# Patient Record
Sex: Female | Born: 1955 | Race: Black or African American | Hispanic: No | Smoking: Never smoker
Health system: Southern US, Community
[De-identification: ages and names within clinical notes are randomized; demographics above are authoritative.]

## PROBLEM LIST (undated history)

## (undated) DIAGNOSIS — D869 Sarcoidosis, unspecified: Secondary | ICD-10-CM

---

## 2018-08-09 ENCOUNTER — Other Ambulatory Visit: Payer: Self-pay

## 2018-08-09 ENCOUNTER — Emergency Department (HOSPITAL_COMMUNITY): Payer: Managed Care, Other (non HMO)

## 2018-08-09 ENCOUNTER — Inpatient Hospital Stay (HOSPITAL_COMMUNITY)
Admission: EM | Admit: 2018-08-09 | Discharge: 2018-08-12 | DRG: 640 | Disposition: A | Discharge status: Home Health | Payer: Managed Care, Other (non HMO) | Attending: Internal Medicine | Admitting: Internal Medicine

## 2018-08-09 ENCOUNTER — Encounter (HOSPITAL_COMMUNITY): Payer: Self-pay | Admitting: Emergency Medicine

## 2018-08-09 DIAGNOSIS — R410 Disorientation, unspecified: Secondary | ICD-10-CM

## 2018-08-09 DIAGNOSIS — Z79899 Other long term (current) drug therapy: Secondary | ICD-10-CM

## 2018-08-09 DIAGNOSIS — D869 Sarcoidosis, unspecified: Secondary | ICD-10-CM | POA: Diagnosis present

## 2018-08-09 DIAGNOSIS — Z681 Body mass index (BMI) 19 or less, adult: Secondary | ICD-10-CM

## 2018-08-09 DIAGNOSIS — G9341 Metabolic encephalopathy: Secondary | ICD-10-CM | POA: Diagnosis present

## 2018-08-09 DIAGNOSIS — E876 Hypokalemia: Secondary | ICD-10-CM | POA: Diagnosis present

## 2018-08-09 DIAGNOSIS — E43 Unspecified severe protein-calorie malnutrition: Secondary | ICD-10-CM

## 2018-08-09 HISTORY — DX: Sarcoidosis, unspecified: D86.9

## 2018-08-09 LAB — CBC
HCT: 35.2 % — ABNORMAL LOW (ref 36.0–46.0)
Hemoglobin: 11.1 g/dL — ABNORMAL LOW (ref 12.0–15.0)
MCH: 25.6 pg — ABNORMAL LOW (ref 26.0–34.0)
MCHC: 31.5 g/dL (ref 30.0–36.0)
MCV: 81.1 fL (ref 80.0–100.0)
Platelets: 175 10*3/uL (ref 150–400)
RBC: 4.34 MIL/uL (ref 3.87–5.11)
RDW: 15.9 % — ABNORMAL HIGH (ref 11.5–15.5)
WBC: 3.1 10*3/uL — ABNORMAL LOW (ref 4.0–10.5)
nRBC: 0 % (ref 0.0–0.2)

## 2018-08-09 LAB — URINALYSIS, ROUTINE W REFLEX MICROSCOPIC
Bilirubin Urine: NEGATIVE
Glucose, UA: NEGATIVE mg/dL
Hgb urine dipstick: NEGATIVE
Ketones, ur: NEGATIVE mg/dL
Nitrite: NEGATIVE
Protein, ur: NEGATIVE mg/dL
Specific Gravity, Urine: 1.008 (ref 1.005–1.030)
pH: 7 (ref 5.0–8.0)

## 2018-08-09 LAB — CBG MONITORING, ED: Glucose-Capillary: 87 mg/dL (ref 70–99)

## 2018-08-09 LAB — COMPREHENSIVE METABOLIC PANEL
ALT: 15 U/L (ref 0–44)
AST: 22 U/L (ref 15–41)
Albumin: 4 g/dL (ref 3.5–5.0)
Alkaline Phosphatase: 37 U/L — ABNORMAL LOW (ref 38–126)
Anion gap: 9 (ref 5–15)
BUN: 16 mg/dL (ref 8–23)
CO2: 28 mmol/L (ref 22–32)
Calcium: 14.4 mg/dL (ref 8.9–10.3)
Chloride: 98 mmol/L (ref 98–111)
Creatinine, Ser: 1.1 mg/dL — ABNORMAL HIGH (ref 0.44–1.00)
GFR calc Af Amer: >60 mL/min (ref >60)
GFR calc non Af Amer: 54 mL/min — ABNORMAL LOW (ref >60)
Glucose, Bld: 96 mg/dL (ref 70–99)
Potassium: 2.8 mmol/L — ABNORMAL LOW (ref 3.5–5.1)
Sodium: 135 mmol/L (ref 135–145)
Total Bilirubin: 0.7 mg/dL (ref 0.3–1.2)
Total Protein: 9.3 g/dL — ABNORMAL HIGH (ref 6.5–8.1)

## 2018-08-09 LAB — I-STAT TROPONIN, ED: TROPONIN I, POC: 0.04 ng/mL (ref 0.00–0.08)

## 2018-08-09 LAB — MAGNESIUM: Magnesium: 2.4 mg/dL (ref 1.7–2.4)

## 2018-08-09 LAB — TSH: TSH: 0.843 u[IU]/mL (ref 0.350–4.500)

## 2018-08-09 MED ORDER — LACTATED RINGERS IV SOLN
INTRAVENOUS | Status: DC
  Administered 2018-08-09: via INTRAVENOUS

## 2018-08-09 MED ORDER — SODIUM CHLORIDE 0.9 % IV BOLUS
500.0000 mL | Freq: Once | INTRAVENOUS | Status: AC
  Administered 2018-08-09: 500 mL via INTRAVENOUS

## 2018-08-09 MED ORDER — POTASSIUM CHLORIDE 10 MEQ/100ML IV SOLN
10.0000 meq | INTRAVENOUS | Status: AC
  Administered 2018-08-09 – 2018-08-10 (×4): 10 meq via INTRAVENOUS
  Filled 2018-08-09 (×5): qty 100

## 2018-08-09 NOTE — ED Provider Notes (Signed)
Emergency Department Provider Note   I have reviewed the triage vital signs and the nursing notes.   HISTORY  Chief Complaint Shortness of Breath   HPI Ebony Bryant is a 63 y.o. female with past medical history of sarcoidosis presents to the emergency department with continued altered mental status and fatigue.  Family state that she has had symptoms worsening over the past week.  She went to an outside emergency department with full work-up at that time which showed elevated calcium and low potassium but family state that the patient refused admission at that time.  They followed up with the PCP several days later and report to me that labs had normalized at that time.  Patient underwent CT imaging of the head chest, pelvis with no report of acute findings.  These lab results and imaging results are not available to me at this time.  They state that the patient has continued to decline with sleepiness and muscle twitching.  No fevers.  Patient denies any headaches, chest pain, shortness of breath but history is limited by her somnolence and acute delirium symptoms.  Level 5 caveat applies.  Family state that they are also concerned about possible shortness of breath because times the patient will make a high-pitched noise when she breathes for a single breath and then return to normal breathing.  They note that she typically does this when she is agitated or not wanting to respond to questions.   Past Medical History:  Diagnosis Date  . Sarcoidosis     There are no active problems to display for this patient.   Allergies Patient has no known allergies.  No family history on file.  Social History Social History   Tobacco Use  . Smoking status: Not on file  Substance Use Topics  . Alcohol use: Not on file  . Drug use: Not on file    Review of Systems  Constitutional: No fever/chills Eyes: No visual changes. ENT: No sore throat. Cardiovascular: Denies chest  pain. Respiratory: Denies shortness of breath. Gastrointestinal: No abdominal pain.  No nausea, no vomiting.  No diarrhea.  No constipation. Genitourinary: Negative for dysuria. Musculoskeletal: Negative for back pain. Skin: Negative for rash. Neurological: Negative for headaches, focal weakness or numbness.  10-point ROS otherwise negative.  ____________________________________________   PHYSICAL EXAM:  VITAL SIGNS: ED Triage Vitals  Enc Vitals Group     BP 08/09/18 1922 (!) 173/105     Pulse Rate 08/09/18 1922 93     Resp 08/09/18 1922 20     Temp 08/09/18 1922 (!) 97.5 F (36.4 C)     Temp Source 08/09/18 1922 Oral     SpO2 08/09/18 1922 96 %     Weight 08/09/18 1923 98 lb (44.5 kg)     Height 08/09/18 1923 5\' 6"  (1.676 m)   Constitutional: Drowsy and not very cooperative. Well appearing and in no acute distress. Eyes: Conjunctivae are normal.  Head: Atraumatic. Nose: No congestion/rhinnorhea. Mouth/Throat: Mucous membranes are dry.  Neck: No stridor.   Cardiovascular: Normal rate, regular rhythm. Good peripheral circulation. Grossly normal heart sounds.   Respiratory: Normal respiratory effort.  No retractions. Lungs CTAB. Gastrointestinal: Soft and nontender. No distention.  Musculoskeletal: No lower extremity tenderness nor edema. No gross deformities of extremities. Normal ROM of upper and lower extremities.  Neurologic:  Normal speech and language. No gross focal neurologic deficits are appreciated. Normal CN exam 2-12.  Skin:  Skin is warm, dry and intact. No rash  noted.   ____________________________________________   LABS (all labs ordered are listed, but only abnormal results are displayed)  Labs Reviewed  COMPREHENSIVE METABOLIC PANEL - Abnormal; Notable for the following components:      Result Value   Potassium 2.8 (*)    Creatinine, Ser 1.10 (*)    Calcium 14.4 (*)    Total Protein 9.3 (*)    Alkaline Phosphatase 37 (*)    GFR calc non Af Amer 54  (*)    All other components within normal limits  CBC - Abnormal; Notable for the following components:   WBC 3.1 (*)    Hemoglobin 11.1 (*)    HCT 35.2 (*)    MCH 25.6 (*)    RDW 15.9 (*)    All other components within normal limits  URINALYSIS, ROUTINE W REFLEX MICROSCOPIC - Abnormal; Notable for the following components:   APPearance HAZY (*)    Leukocytes, UA SMALL (*)    Bacteria, UA FEW (*)    All other components within normal limits  URINE CULTURE  TSH  MAGNESIUM  TROPONIN I  CBG MONITORING, ED  I-STAT TROPONIN, ED   ____________________________________________  EKG   EKG Interpretation  Date/Time:  Wednesday August 09 2018 20:36:51 EST Ventricular Rate:  91 PR Interval:  156 QRS Duration: 91 QT Interval:  355 QTC Calculation: 437 R Axis:   70 Text Interpretation:  Sinus rhythm Ventricular premature complex Biatrial enlargement Probable LVH with secondary repol abnrm No STEMI.  Confirmed by Alona Bene [phone]) on 08/09/2018 11:47:14 PM       ____________________________________________  RADIOLOGY  Dg Chest 2 View  Result Date: 08/09/2018 CLINICAL DATA:  Shortness of breath, clinical history of sarcoid. EXAM: CHEST - 2 VIEW COMPARISON:  None. FINDINGS: Hilar retraction bilaterally. Diffuse interstitial reticular opacity suggesting fibrosis. More confluent left greater than right upper lobe opacity. No pleural effusion. Borderline cardiomegaly. No pneumothorax. IMPRESSION: 1. Diffuse reticulonodular pattern with hilar retraction and probable fibrosis in the mid to upper lung zones, likely related to given history of sarcoidosis. 2. There is ground-glass opacity in the left greater than right upper lobes, superimposed infection or acute inflammatory process is difficult to exclude. Electronically Signed   By: Jasmine Pang M.D.   On: 08/09/2018 21:40    ____________________________________________   PROCEDURES  Procedure(s) performed:    Procedures  CRITICAL CARE Performed by: Maia Plan Total critical care time: 35 minutes Critical care time was exclusive of separately billable procedures and treating other patients. Critical care was necessary to treat or prevent imminent or life-threatening deterioration. Critical care was time spent personally by me on the following activities: development of treatment plan with patient and/or surrogate as well as nursing, discussions with consultants, evaluation of patient's response to treatment, examination of patient, obtaining history from patient or surrogate, ordering and performing treatments and interventions, ordering and review of laboratory studies, ordering and review of radiographic studies, pulse oximetry and re-evaluation of patient's condition.  Alona Bene, MD Emergency Medicine  ____________________________________________   INITIAL IMPRESSION / ASSESSMENT AND PLAN / ED COURSE  Pertinent labs & imaging results that were available during my care of the patient were reviewed by me and considered in my medical decision making (see chart for details).  Patient presents to the emergency department with altered mental status.  No focal deficits but patient is more delirious.  She is unable to carry on a full conversation and is somewhat agitated at times.  She does make eye  contact and follow commands.  She is protecting her airway.  I do not appreciate any focal or unilateral neurological deficits to require head imaging.  Family tells me that patient had full set of imaging done as an outpatient several days ago.  The patient's lab work here does show significant hypercalcemia and hypokalemia which are likely contributing to her symptoms.  I have started IV fluids and will supplement her potassium.  UA is pending.  Patient is afebrile.  Doubt sepsis or ACS.  Plan for admission.   Discussed patient's case with Hospitalist, Dr. Sharl MaLama to request admission. Patient and  family (if present) updated with plan. Care transferred to Hospitalist service.  I reviewed all nursing notes, vitals, pertinent old records, EKGs, labs, imaging (as available).  ____________________________________________  FINAL CLINICAL IMPRESSION(S) / ED DIAGNOSES  Final diagnoses:  Hypercalcemia  Hypokalemia  Delirium     MEDICATIONS GIVEN DURING THIS VISIT:  Medications  lactated ringers infusion (has no administration in time range)  potassium chloride 10 mEq in 100 mL IVPB (has no administration in time range)  sodium chloride 0.9 % bolus 500 mL (0 mLs Intravenous Stopped 08/09/18 2317)    Note:  This document was prepared using Dragon voice recognition software and may include unintentional dictation errors.  Alona BeneJoshua Long, MD Emergency Medicine    Long, Arlyss RepressJoshua G, MD 08/09/18 [phone]

## 2018-08-09 NOTE — ED Triage Notes (Signed)
Per family pt has been having SOB, weakness and confusion since December 25. Pt was seen at ER 12/30 and was sent home with no diagnosis. Pt was seen by pcp on 1/2 and had blood work and CT scans done. Pt family states pt has progressively gotten worse since dr appt.

## 2018-08-09 NOTE — ED Notes (Signed)
Date and time results received: 08/09/18 2135 (use smartphrase ".now" to insert current time)  Test: ca  Critical Value: 14.4  Name of Provider Notified: long  Orders Received? Or Actions Taken?: Orders Received - See Orders for details

## 2018-08-10 ENCOUNTER — Other Ambulatory Visit: Payer: Self-pay

## 2018-08-10 ENCOUNTER — Encounter (HOSPITAL_COMMUNITY): Payer: Self-pay | Admitting: *Deleted

## 2018-08-10 DIAGNOSIS — E43 Unspecified severe protein-calorie malnutrition: Secondary | ICD-10-CM | POA: Diagnosis present

## 2018-08-10 DIAGNOSIS — Z681 Body mass index (BMI) 19 or less, adult: Secondary | ICD-10-CM | POA: Diagnosis not present

## 2018-08-10 DIAGNOSIS — Z79899 Other long term (current) drug therapy: Secondary | ICD-10-CM | POA: Diagnosis not present

## 2018-08-10 DIAGNOSIS — G9341 Metabolic encephalopathy: Secondary | ICD-10-CM | POA: Diagnosis present

## 2018-08-10 DIAGNOSIS — R410 Disorientation, unspecified: Secondary | ICD-10-CM

## 2018-08-10 DIAGNOSIS — D869 Sarcoidosis, unspecified: Secondary | ICD-10-CM | POA: Diagnosis present

## 2018-08-10 DIAGNOSIS — E876 Hypokalemia: Secondary | ICD-10-CM | POA: Diagnosis present

## 2018-08-10 DIAGNOSIS — R531 Weakness: Secondary | ICD-10-CM | POA: Diagnosis not present

## 2018-08-10 LAB — CBC WITH DIFFERENTIAL/PLATELET
Abs Immature Granulocytes: 0.01 10*3/uL (ref 0.00–0.07)
BASOS ABS: 0 10*3/uL (ref 0.0–0.1)
Basophils Relative: 1 %
Eosinophils Absolute: 0.1 10*3/uL (ref 0.0–0.5)
Eosinophils Relative: 4 %
HCT: 30.5 % — ABNORMAL LOW (ref 36.0–46.0)
Hemoglobin: 9.6 g/dL — ABNORMAL LOW (ref 12.0–15.0)
Immature Granulocytes: 0 %
Lymphocytes Relative: 21 %
Lymphs Abs: 0.5 10*3/uL — ABNORMAL LOW (ref 0.7–4.0)
MCH: 26.4 pg (ref 26.0–34.0)
MCHC: 31.5 g/dL (ref 30.0–36.0)
MCV: 83.8 fL (ref 80.0–100.0)
Monocytes Absolute: 0.5 10*3/uL (ref 0.1–1.0)
Monocytes Relative: 19 %
NRBC: 0 % (ref 0.0–0.2)
Neutro Abs: 1.4 10*3/uL — ABNORMAL LOW (ref 1.7–7.7)
Neutrophils Relative %: 55 %
Platelets: 139 10*3/uL — ABNORMAL LOW (ref 150–400)
RBC: 3.64 MIL/uL — ABNORMAL LOW (ref 3.87–5.11)
RDW: 15.9 % — ABNORMAL HIGH (ref 11.5–15.5)
WBC: 2.6 10*3/uL — AB (ref 4.0–10.5)

## 2018-08-10 LAB — CBC
HCT: 33.3 % — ABNORMAL LOW (ref 36.0–46.0)
Hemoglobin: 10.6 g/dL — ABNORMAL LOW (ref 12.0–15.0)
MCH: 26.1 pg (ref 26.0–34.0)
MCHC: 31.8 g/dL (ref 30.0–36.0)
MCV: 82 fL (ref 80.0–100.0)
Platelets: 130 10*3/uL — ABNORMAL LOW (ref 150–400)
RBC: 4.06 MIL/uL (ref 3.87–5.11)
RDW: 15.9 % — ABNORMAL HIGH (ref 11.5–15.5)
WBC: 2.1 10*3/uL — ABNORMAL LOW (ref 4.0–10.5)
nRBC: 0 % (ref 0.0–0.2)

## 2018-08-10 LAB — COMPREHENSIVE METABOLIC PANEL
ALT: 13 U/L (ref 0–44)
AST: 19 U/L (ref 15–41)
Albumin: 3.3 g/dL — ABNORMAL LOW (ref 3.5–5.0)
Alkaline Phosphatase: 34 U/L — ABNORMAL LOW (ref 38–126)
Anion gap: 6 (ref 5–15)
BUN: 15 mg/dL (ref 8–23)
CO2: 29 mmol/L (ref 22–32)
Calcium: 13.4 mg/dL (ref 8.9–10.3)
Chloride: 104 mmol/L (ref 98–111)
Creatinine, Ser: 0.95 mg/dL (ref 0.44–1.00)
GFR calc Af Amer: >60 mL/min (ref >60)
Glucose, Bld: 84 mg/dL (ref 70–99)
Potassium: 3.3 mmol/L — ABNORMAL LOW (ref 3.5–5.1)
Sodium: 139 mmol/L (ref 135–145)
Total Bilirubin: 0.7 mg/dL (ref 0.3–1.2)
Total Protein: 7.8 g/dL (ref 6.5–8.1)

## 2018-08-10 LAB — TROPONIN I: Troponin I: <0.03 ng/mL (ref <0.03)

## 2018-08-10 MED ORDER — BOOST / RESOURCE BREEZE PO LIQD CUSTOM
1.0000 | Freq: Three times a day (TID) | ORAL | Status: DC
  Administered 2018-08-10 – 2018-08-11 (×4): 1 via ORAL

## 2018-08-10 MED ORDER — ENOXAPARIN SODIUM 30 MG/0.3ML ~~LOC~~ SOLN
30.0000 mg | SUBCUTANEOUS | Status: DC
  Administered 2018-08-11 – 2018-08-12 (×2): 30 mg via SUBCUTANEOUS
  Filled 2018-08-10 (×2): qty 0.3

## 2018-08-10 MED ORDER — LORAZEPAM 2 MG/ML IJ SOLN: 1.0000 mg | INTRAMUSCULAR | Status: DC | PRN

## 2018-08-10 MED ORDER — ONDANSETRON HCL 4 MG/2ML IJ SOLN: 4.0000 mg | Freq: Four times a day (QID) | INTRAMUSCULAR | Status: DC | PRN

## 2018-08-10 MED ORDER — SODIUM CHLORIDE 0.9 % IV SOLN
INTRAVENOUS | Status: DC
  Administered 2018-08-10 – 2018-08-12 (×5): via INTRAVENOUS

## 2018-08-10 MED ORDER — LORAZEPAM 2 MG/ML IJ SOLN
INTRAMUSCULAR | Status: AC
  Administered 2018-08-10: 1 mg
  Filled 2018-08-10: qty 1

## 2018-08-10 MED ORDER — POLYETHYLENE GLYCOL 3350 17 G PO PACK
17.0000 g | PACK | Freq: Every day | ORAL | Status: DC
  Administered 2018-08-10 – 2018-08-12 (×3): 17 g via ORAL
  Filled 2018-08-10 (×3): qty 1

## 2018-08-10 MED ORDER — ENOXAPARIN SODIUM 40 MG/0.4ML ~~LOC~~ SOLN
40.0000 mg | SUBCUTANEOUS | Status: DC
  Administered 2018-08-10: 40 mg via SUBCUTANEOUS
  Filled 2018-08-10: qty 0.4

## 2018-08-10 MED ORDER — POTASSIUM CHLORIDE CRYS ER 20 MEQ PO TBCR
20.0000 meq | EXTENDED_RELEASE_TABLET | Freq: Once | ORAL | Status: AC
  Administered 2018-08-10: 20 meq via ORAL
  Filled 2018-08-10: qty 1

## 2018-08-10 MED ORDER — METHYLPREDNISOLONE SODIUM SUCC 125 MG IJ SOLR
60.0000 mg | Freq: Two times a day (BID) | INTRAMUSCULAR | Status: DC
  Administered 2018-08-10 – 2018-08-12 (×4): 60 mg via INTRAVENOUS
  Filled 2018-08-10 (×4): qty 2

## 2018-08-10 MED ORDER — ONDANSETRON HCL 4 MG PO TABS: 4.0000 mg | ORAL_TABLET | Freq: Four times a day (QID) | ORAL | Status: DC | PRN

## 2018-08-10 MED ORDER — TUBERCULIN PPD 5 UNIT/0.1ML ID SOLN
5.0000 [IU] | Freq: Once | INTRADERMAL | Status: AC
  Administered 2018-08-10: 5 [IU] via INTRADERMAL
  Filled 2018-08-10: qty 0.1

## 2018-08-10 NOTE — ED Notes (Signed)
Family gave belonging bag, report given per report, Tech will take pt to floor

## 2018-08-10 NOTE — ED Notes (Signed)
Date and time results received: 08/10/18 0607   Test: Calcium Critical Value: 13.4  Name of Provider Notified: Sharl Ma, MD

## 2018-08-10 NOTE — H&P (Addendum)
TRH H&P    Patient Demographics:    Ebony RenRegina Bryant, is a 63 y.o. female  MRN: [id number]  DOB - 18-Mar-1956  Admit Date - 08/09/2018  Referring MD/NP/PA: Alona BeneJoshua Long  Outpatient Primary MD for the patient is Aggie Cosieresai, Balaji, MD  Patient coming from: Home  Chief complaint-confusion   HPI:    Ebony RenRegina Wigen  is a 63 y.o. female, with history of stage IV sarcoidosis, who was brought to the hospital with complaints of altered mental status and fatigue.  Patient at this time is confused and unable to provide any history.  As per patient's daughter patient has been fatigued and was seen by PCP recently.  She underwent CT imaging of head, chest, abdomen pelvis with no report of acute finding.  The imaging is not available at this time.  [facility]. As per daughter she has been declining with excessive sleepiness and muscle twitching. No previous history of stroke or seizures. No recent nausea vomiting or diarrhea. No history of abdominal pain but patient has been having cramping/muscle twitching In the ED patient was found to have hypercalcemia with calcium 14.1, potassium 2.8.     Review of systems:    In addition to the HPI above,   All other systems reviewed and are negative.    Past History of the following :    Past Medical History:  Diagnosis Date  . Sarcoidosis         Social History:      Social History   Tobacco Use  . Smoking status: Not on file  Substance Use Topics  . Alcohol use: Not on file       Family History :   No family history of cancer   Home Medications:   Prior to Admission medications   Medication Sig Start Date End Date Taking? Authorizing Provider  Losartan Potassium-HCTZ (HYZAAR PO) Take 1 tablet by mouth daily.   Yes [provider]  Multiple Vitamin (MULTIVITAMIN WITH MINERALS) TABS tablet Take 1 tablet by mouth daily.    Yes [provider]  VITAMIN D PO Take 1 tablet by mouth daily.   Yes [provider]     Allergies:    No Known Allergies   Physical Exam:   Vitals  Blood pressure (!) 190/119, pulse 93, temperature (!) 97.5 F (36.4 C), temperature source Oral, resp. rate 20, height 5\' 6"  (1.676 m), weight 44.5 kg, SpO2 96 %.  1.  General: Patient is lethargic  2. Psychiatric: Confused, lacks insight and judgment.  3. Neurologic: Moving all extremities  4. Eyes :  anicteric sclerae, moist conjunctivae with no lid lag. PERRLA.  5. ENMT:  Oropharynx clear with moist mucous membranes and good dentition  6. Neck:  supple, no cervical lymphadenopathy appriciated, No thyromegaly  7. Respiratory : Normal respiratory effort, good air movement bilaterally,clear to  auscultation bilaterally  8. Cardiovascular : RRR, no gallops, rubs or murmurs, no leg edema  9. Gastrointestinal:  Positive bowel sounds, abdomen soft, non-tender to palpation,no hepatosplenomegaly,  no rigidity or guarding       10. Skin:  No cyanosis, normal texture and turgor, no rash, lesions or ulcers  11.Musculoskeletal:  Good muscle tone,  joints appear normal , no effusions,  normal range of motion    Data Review:    CBC Recent Labs  Lab 08/09/18 2051  WBC 3.1*  HGB 11.1*  HCT 35.2*  PLT 175  MCV 81.1  MCH 25.6*  MCHC 31.5  RDW 15.9*   ------------------------------------------------------------------------------------------------------------------  Results for orders placed or performed during the hospital encounter of 08/09/18 (from the past 48 hour(s))  Comprehensive metabolic panel     Status: Abnormal   Collection Time: 08/09/18  8:51 PM  Result Value Ref Range   Sodium 135 135 - 145 mmol/L   Potassium 2.8 (L) 3.5 - 5.1 mmol/L   Chloride 98 98 - 111 mmol/L   CO2 28 22 - 32 mmol/L   Glucose, Bld 96 70 - 99 mg/dL   BUN 16 8 - 23 mg/dL   Creatinine, Ser 8.111.10 (H) 0.44 - 1.00  mg/dL   Calcium 91.414.4 (HH) 8.9 - 10.3 mg/dL    Comment: CRITICAL RESULT CALLED TO, READ BACK BY AND VERIFIED WITH: EVANS,H ON 08/09/18 AT 2135 BY LOY,C    Total Protein 9.3 (H) 6.5 - 8.1 g/dL   Albumin 4.0 3.5 - 5.0 g/dL   AST 22 15 - 41 U/L   ALT 15 0 - 44 U/L   Alkaline Phosphatase 37 (L) 38 - 126 U/L   Total Bilirubin 0.7 0.3 - 1.2 mg/dL   GFR calc non Af Amer 54 (L) >60 mL/min   GFR calc Af Amer >60 >60 mL/min   Anion gap 9 5 - 15    Comment: Performed at [facility], [address] [place], [place] [address]  CBC     Status: Abnormal   Collection Time: 08/09/18  8:51 PM  Result Value Ref Range   WBC 3.1 (L) 4.0 - 10.5 K/uL   RBC 4.34 3.87 - 5.11 MIL/uL   Hemoglobin 11.1 (L) 12.0 - 15.0 g/dL   HCT 62.135.2 (L) 30.836.0 - 65.746.0 %   MCV 81.1 80.0 - 100.0 fL   MCH 25.6 (L) 26.0 - 34.0 pg   MCHC 31.5 30.0 - 36.0 g/dL   RDW 84.615.9 (H) 96.211.5 - 95.215.5 %   Platelets 175 150 - 400 K/uL   nRBC 0.0 0.0 - 0.2 %    Comment: Performed at [facility], [address] [place], [place] [address]  TSH     Status: None   Collection Time: 08/09/18  8:51 PM  Result Value Ref Range   TSH 0.843 0.350 - 4.500 uIU/mL    Comment: Performed by a 3rd Generation assay with a functional sensitivity of <=0.01 uIU/mL. Performed at [facility], [address] [place], [place] [address]   Magnesium     Status: None   Collection Time: 08/09/18  8:51 PM  Result Value Ref Range   Magnesium 2.4 1.7 - 2.4 mg/dL    Comment: Performed at [facility], [address] [place], [place] [address]  CBG monitoring, ED     Status: None   Collection Time: 08/09/18  8:53 PM  Result Value Ref Range   Glucose-Capillary 87 70 - 99 mg/dL  Urinalysis, Routine w reflex microscopic     Status: Abnormal   Collection Time: 08/09/18  9:14 PM  Result Value Ref Range   Color, Urine YELLOW YELLOW  APPearance HAZY (A) CLEAR   Specific Gravity, Urine 1.008 1.005 - 1.030   pH 7.0 5.0 - 8.0   Glucose, UA NEGATIVE NEGATIVE mg/dL   Hgb urine  dipstick NEGATIVE NEGATIVE   Bilirubin Urine NEGATIVE NEGATIVE   Ketones, ur NEGATIVE NEGATIVE mg/dL   Protein, ur NEGATIVE NEGATIVE mg/dL   Nitrite NEGATIVE NEGATIVE   Leukocytes, UA SMALL (A) NEGATIVE   RBC / HPF 0-5 0 - 5 RBC/hpf   WBC, UA 11-20 0 - 5 WBC/hpf   Bacteria, UA FEW (A) NONE SEEN   Squamous Epithelial / LPF 0-5 0 - 5   Hyaline Casts, UA PRESENT    Amorphous Crystal PRESENT     Comment: Performed at [facility], [address], [place], [place] [address]  I-stat troponin, ED     Status: None   Collection Time: 08/09/18  9:26 PM  Result Value Ref Range   Troponin i, poc 0.04 0.00 - 0.08 ng/mL   Comment 3            Comment: Due to the release kinetics of cTnI, a negative result within the first hours of the onset of symptoms does not rule out myocardial infarction with certainty. If myocardial infarction is still suspected, repeat the test at appropriate intervals.     Chemistries  Recent Labs  Lab 08/09/18 2051  NA 135  K 2.8*  CL 98  CO2 28  GLUCOSE 96  BUN 16  CREATININE 1.10*  CALCIUM 14.4*  MG 2.4  AST 22  ALT 15  ALKPHOS 37*  BILITOT 0.7   ------------------------------------------------------------------------------------------------------------------  ------------------------------------------------------------------------------------------------------------------ GFR: Estimated Creatinine Clearance: 37.3 mL/min (A) (by C-G formula based on SCr of 1.1 mg/dL (H)). Liver Function Tests: Recent Labs  Lab 08/09/18 2051  AST 22  ALT 15  ALKPHOS 37*  BILITOT 0.7  PROT 9.3*  ALBUMIN 4.0    Recent Labs  Lab 08/09/18 2053  GLUCAP 87   Lipid Profile: No results for input(s): CHOL, HDL, LDLCALC, TRIG, CHOLHDL, LDLDIRECT in the last 72 hours. Thyroid Function Tests: Recent Labs    08/09/18 2051  TSH 0.843   Anemia Panel: No results for input(s): VITAMINB12, FOLATE, FERRITIN, TIBC, IRON, RETICCTPCT in the last 72  hours.  --------------------------------------------------------------------------------------------------------------- Urine analysis:    Component Value Date/Time   COLORURINE YELLOW 08/09/2018 2114   APPEARANCEUR HAZY (A) 08/09/2018 2114   LABSPEC 1.008 08/09/2018 2114   PHURINE 7.0 08/09/2018 2114   GLUCOSEU NEGATIVE 08/09/2018 2114   HGBUR NEGATIVE 08/09/2018 2114   BILIRUBINUR NEGATIVE 08/09/2018 2114   KETONESUR NEGATIVE 08/09/2018 2114   PROTEINUR NEGATIVE 08/09/2018 2114   NITRITE NEGATIVE 08/09/2018 2114   LEUKOCYTESUR SMALL (A) 08/09/2018 2114      Imaging Results:    Dg Chest 2 View  Result Date: 08/09/2018 CLINICAL DATA:  Shortness of breath, clinical history of sarcoid. EXAM: CHEST - 2 VIEW COMPARISON:  None. FINDINGS: Hilar retraction bilaterally. Diffuse interstitial reticular opacity suggesting fibrosis. More confluent left greater than right upper lobe opacity. No pleural effusion. Borderline cardiomegaly. No pneumothorax. IMPRESSION: 1. Diffuse reticulonodular pattern with hilar retraction and probable fibrosis in the mid to upper lung zones, likely related to given history of sarcoidosis. 2. There is ground-glass opacity in the left greater than right upper lobes, superimposed infection or acute inflammatory process is difficult to exclude. Electronically Signed   By: Jasmine Pang M.D.   On: 08/09/2018 21:40    My personal review of EKG: Rhythm NSR, biatrial enlargement.  Assessment & Plan:    Active Problems:   Hypercalcemia   1. Hypercalcemia-likely secondary to sarcoidosis, patient also takes vitamin D supplementation at home.  Will start normal saline at 125 mL/h.  Also start Solu-Medrol 60 mg IV every 12 hours.  Check vitamin D level, PTH.  Patient will have to be off calcium and vitamin D supplementation  2. Altered mental status-likely from above.  Patient had recent CT head and abdomen pelvis and chest done at [facility].  All the  imaging results were normal as per patient's daughter.   Consider obtaining the film results in a.m.  3. Hypokalemia-potassium was 2.8, will replace potassium and check BMP in a.m.  4. Sarcoidosis stage IV-chest x-ray shows groundglass opacity bilaterally.  She is followed by pulmonologist as outpatient.   DVT Prophylaxis-   Lovenox   AM Labs Ordered, also please review Full Orders  Family Communication: Admission, patients condition and plan of care including tests being ordered have been discussed with the patient's daughter at bedside* who indicate understanding and agree with the plan and Code Status.  Code Status: Full code  Admission status: Inpatient: Based on patients clinical presentation and evaluation of above clinical data, I have made determination that patient meets Inpatient criteria at this time.  Patient will need more than 2 midnight stay in the hospital.  Time spent in minutes : 60 minutes   Meredeth Ide M.D on 08/10/2018 at 12:20 AM  Between 7am to 7pm - Pager - [phone]. After 7pm go to [email or web address] - password [facility]   [facility] - Office  [phone]

## 2018-08-10 NOTE — Plan of Care (Signed)
  Problem: Acute Rehab PT Goals(only PT should resolve) Goal: Pt Will Go Supine/Side To Sit Outcome: Progressing Flowsheets (Taken 08/10/2018 1210) Pt will go Supine/Side to Sit: with min guard assist Goal: Patient Will Transfer Sit To/From Stand Outcome: Progressing Flowsheets (Taken 08/10/2018 1210) Patient will transfer sit to/from stand: with min guard assist Goal: Pt Will Transfer Bed To Chair/Chair To Bed Outcome: Progressing Flowsheets (Taken 08/10/2018 1210) Pt will Transfer Bed to Chair/Chair to Bed: min guard assist Goal: Pt Will Ambulate Outcome: Progressing Flowsheets (Taken 08/10/2018 1210) Pt will Ambulate: 50 feet; with minimal assist; with rolling walker   12:12 PM, 08/10/18 Ocie Bob, MPT Physical Therapist with [facility] [phone] office [phone] mobile phone

## 2018-08-10 NOTE — Progress Notes (Signed)
Has been more drowsy and weak lately according to family.  Assisted to bathroom and able to walk with standby.  Has lost around 20 lbs in last two months and according to daughter patient has been working 7 days a week and also taking care of family and sometimes just forgets to eat or doesn't have time .  Reports no bm since around 12/31.  Contacted Dr. Renford Dills with this information and he ordered Miralax daily. TB test administered to right forearm today at 1620.  Daughter at bedside.

## 2018-08-10 NOTE — Clinical Social Work Note (Signed)
Clinical Social Work Assessment  Patient Details  Name: Ebony Bryant MRN: [id number] Date of Birth: Mar 23, 1956  Date of referral:  08/10/18               Reason for consult:  Facility Placement                Permission sought to share information with:    Permission granted to share information::     Name::        Agency::     Relationship::     Contact Information:  daughter, Lilia Argue and sister, Sabino Dick were at bedside.   Housing/Transportation Living arrangements for the past 2 months:  Single Family Home Source of Information:  Adult Children, Siblings Patient Interpreter Needed:  None Criminal Activity/Legal Involvement Pertinent to Current Situation/Hospitalization:  No - Comment as needed Significant Relationships:  Adult Children, Siblings Lives with:  Self Do you feel safe going back to the place where you live?  Yes Need for family participation in patient care:  Yes (Comment)  Care giving concerns:  None identified. Patient is completely independent at baseline.    Social Worker assessment / plan:  Patient is independent at baseline, works. Patient has been staying with her sister in her sister's family care home since Sunday due to being confused and unwell. Family would like for patient to discharge back to the [facility] until she is back at baseline.    Employment status:  Technical brewer:  Managed Care PT Recommendations:  Skilled Nursing Facility Information / Referral to community resources:  Skilled Nursing Facility  Patient/Family's Response to care: Family would like for patient to discharge to her the [facility] owned by her sister.  Patient/Family's Understanding of and Emotional Response to Diagnosis, Current Treatment, and Prognosis:  Family understands her diagnosis, treatment and prognosis.   Emotional Assessment Appearance:  Appears stated age Attitude/Demeanor/Rapport:    Affect (typically observed):  Calm Orientation:  Oriented to  Self, Oriented to Place Alcohol / Substance use:  Not Applicable Psych involvement (Current and /or in the community):  No (Comment)  Discharge Needs  Concerns to be addressed:  Discharge Planning Concerns Readmission within the last 30 days:  No Current discharge risk:  None Barriers to Discharge:  No Barriers Identified   Annice Needy, LCSW 08/10/2018, 3:58 PM

## 2018-08-10 NOTE — Evaluation (Signed)
Physical Therapy Evaluation Patient Details Name: Keyonta Klehr MRN: [id number] DOB: 1955-09-20 Today's Date: 08/10/2018   History of Present Illness  Carmon Agredano  is a 63 y.o. female, with history of stage IV sarcoidosis, who was brought to the hospital with complaints of altered mental status and fatigue.  Patient at this time is confused and unable to provide any history.  As per patient's daughter patient has been fatigued and was seen by PCP recently.  She underwent CT imaging of head, chest, abdomen pelvis with no report of acute finding.  The imaging is not available at this time.  It was done at [facility].    Clinical Impression  Patient functioning well below baseline and limited for functional mobility as stated below secondary to BLE weakness, fatigue and poor standing balance.  Patient tolerated sitting up in chair with her sister present after therapy.  Patient will benefit from continued physical therapy in hospital and recommended venue below to increase strength, balance, endurance for safe ADLs and gait.     Follow Up Recommendations SNF    Equipment Recommendations  Rolling walker with 5" wheels    Recommendations for Other Services       Precautions / Restrictions Precautions Precautions: Fall Restrictions Weight Bearing Restrictions: No      Mobility  Bed Mobility Overal bed mobility: Needs Assistance Bed Mobility: Supine to Sit     Supine to sit: Min assist;Mod assist     General bed mobility comments: slow labored movement  Transfers Overall transfer level: Needs assistance Equipment used: Rolling walker (2 wheeled) Transfers: Sit to/from [employer] Sit to Stand: Min assist;Mod assist Stand pivot transfers: Min assist;Mod assist       General transfer comment: slow labored movement, requires frequent verbal/tactile cueing  Ambulation/Gait Ambulation/Gait assistance: Min assist;Mod assist Gait Distance (Feet):  30 Feet Assistive device: Rolling walker (2 wheeled) Gait Pattern/deviations: Decreased step length - right;Decreased step length - left;Decreased stride length Gait velocity: slow   General Gait Details: slow labored cadence with occasional buckling of knees due to weakness, limited secondary to fatigue/lethargy  Stairs            Wheelchair Mobility    Modified Rankin (Stroke Patients Only)       Balance Overall balance assessment: Needs assistance Sitting-balance support: Feet supported;No upper extremity supported Sitting balance-Leahy Scale: Fair     Standing balance support: During functional activity;No upper extremity supported Standing balance-Leahy Scale: Poor Standing balance comment: fair/poor with RW                             Pertinent Vitals/Pain Pain Assessment: No/denies pain    Home Living Family/patient expects to be discharged to:: Private residence Living Arrangements: Alone Available Help at Discharge: Family Type of Home: House Home Access: Level entry     Home Layout: One level Home Equipment: None      Prior Function Level of Independence: Independent         Comments: community ambulator, drives     Higher education careers adviser        Extremity/Trunk Assessment   Upper Extremity Assessment Upper Extremity Assessment: Generalized weakness    Lower Extremity Assessment Lower Extremity Assessment: Generalized weakness    Cervical / Trunk Assessment Cervical / Trunk Assessment: Normal  Communication   Communication: Expressive difficulties  Cognition Arousal/Alertness: Awake/alert;Lethargic Behavior During Therapy: Flat affect Overall Cognitive Status: Impaired/Different from baseline Area of Impairment:  Orientation                 Orientation Level: Person             General Comments: slow to respond to questions, slightly lethargic      General Comments      Exercises     Assessment/Plan     PT Assessment Patient needs continued PT services  PT Problem List Decreased strength;Decreased activity tolerance;Decreased balance;Decreased mobility       PT Treatment Interventions Gait training;Stair training;Functional mobility training;Therapeutic activities;Patient/family education;Therapeutic exercise    PT Goals (Current goals can be found in the Care Plan section)  Acute Rehab PT Goals Patient Stated Goal: return home  PT Goal Formulation: With patient/family Time For Goal Achievement: 08/24/18 Potential to Achieve Goals: Good    Frequency Min 3X/week   Barriers to discharge        Co-evaluation               AM-PAC PT "6 Clicks" Mobility  Outcome Measure Help needed turning from your back to your side while in a flat bed without using bedrails?: Total Help needed moving from lying on your back to sitting on the side of a flat bed without using bedrails?: Total Help needed moving to and from a bed to a chair (including a wheelchair)?: Total Help needed standing up from a chair using your arms (e.g., wheelchair or bedside chair)?: A Lot Help needed to walk in hospital room?: A Lot Help needed climbing 3-5 steps with a railing? : A Lot 6 Click Score: 9    End of Session Equipment Utilized During Treatment: Gait belt Activity Tolerance: Patient tolerated treatment well;Patient limited by fatigue;Patient limited by lethargy Patient left: in chair;with call bell/phone within reach;with family/visitor present;with chair alarm set Nurse Communication: Mobility status PT Visit Diagnosis: Unsteadiness on feet (R26.81);Other abnormalities of gait and mobility (R26.89);Muscle weakness (generalized) (M62.81)    Time: 3662-9476 PT Time Calculation (min) (ACUTE ONLY): 27 min   Charges:   PT Evaluation $PT Eval Moderate Complexity: 1 Mod PT Treatments $Therapeutic Activity: 23-37 mins        12:05 PM, 08/10/18 Ocie Bob, MPT Physical Therapist with  [facility] [phone] office [phone] mobile phone

## 2018-08-10 NOTE — Progress Notes (Signed)
Initial Nutrition Assessment  DOCUMENTATION CODES:  Underweight, Severe malnutrition in context of acute illness/injury  INTERVENTION:  Pt would not accept high kcal supps (ensure/vital shake/ice cream) over fear of the calcium they contain. Would appreciate MD reassuring patient the calcium these supplements contain will not harm her.   Please note, Pt has not had a BM in >1 week. Daughter Reports this is "regular" for her   Until agrees to Ensure, Boost Breeze po TID, each supplement provides 250 kcal and 9 grams of protein  NUTRITION DIAGNOSIS:  Severe Malnutrition related to acute illness (vs chronic?) as evidenced by loss of 12.5% bw x 3 months and an estimated intake that has met </= to 50% of needs for >/= 5days.   GOAL:  Patient will meet greater than or equal to 90% of their needs  MONITOR:  Supplement acceptance, Labs, Weight trends, PO intake, I & O's  REASON FOR ASSESSMENT:  Consult Assessment of nutrition requirement/status  ASSESSMENT:  63 y/o female pmhx stage IV sarcoidosis. Brought to ED d/t AMS and fatigue. Has had muscle twictching and confusion. Workup revealed hypercalcemia (14.1) and hypokalemia (2.8). Admitted for electrolyte correction and further evaluation. RD consulted for nutrition assessment.   There is no prior history in chart whatsoever. All information obtained from pt and her daughter at bedside. Pt was exceedingly lethargic and only able to offer short comments.   Per daughter, pt is an extremely active individual. She works 7 days a week for [employer], a company she has worked at for 35 years. 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 Her daughter comments the patient is a "very private person" and tells the other family members little about her health or wellbeing. She also wears large, baggy clothes that cover much of her body. Because of this and her private nature, daughter says they had been largely unaware of how frail she had become. After  her decline was discovered, pt was taken to a care home operated by relatives. There, she has been apparently eating "extremely well".   Pt herself offered little specifics. She says she "maybe" ate 1-2x a day. She says the reason she didn't eat was because she doesn't get hungry. At end of visit, she would also comment food hadnt tasted right-> she says this has resolved though. The Daughter, who is extremely thin herself, elaborates that it is common for their family to be somewhat indifferent to eating. She says they dont think about being hungry and often do not want to put in effort to make a meal. Daughter also reports that pt has significant anxiety which hinders her appetite. All of this said, daughter does believe pts appetite has been impacted by a recent URI and course of PO abx. Daughter comments the pt was drinking 1-2 Ensures/day and taking Vit D PTA. Since they learned of the high calcium, these have been stopped indefinitely. Pt has now developed extreme anxiety and aversion to any foods associated with being high in calcium (dairy)  RD asked about other barriers to intake. RD noted pts last reported BM was 12/31. Daughter/pt confirm this. Daughter says this is normal for the pt. Daughter discloses she also only has a BM 1x a week. She didn't find this odd. RD noted this is not normal and typically constipation is defined as <3 Bms/week. Even if she does not eat much, she should still be passing stools.  Wt wise, daughter says the patient was weighed at 102 lbs at her  PCP on 1/2. Told pt at this vist that pt had lost 10 lbs since November (placing her wt at 112 lbs). She is currently 98 lbs. This loss of 14 lbs x3 months =  12.5% bw. This meets malnutrition criteria.  Pt would not agree to ensure over fear of its calcium levels. RD educated that a high calcium level is essentially never d/t high dietary intake of calcium, unless supplements are being taken. RD pointed out she is on a regular  diet and if MD was concerned about her eating calcium, this would not be the case. She did agree to boost breeze since this is juice based. Will ask MD to reassure pt that Ensure will not harm her.   From what RD could gather, it sounds that pt has largely neglected her own health in favor of working at her job and being a caregiver. RD asked that when she D/C family makes sure she is eating well. Daughter says she will go back to [facility] and they will make sure she eats 3 meals a day.   NFPE deferred. Given her reported very private nature, will retry when patient does not have family in room.   Labs: Calc: 14.4-> 13.4. K: 2.8-> 3.3, Hgb: 9.6, Albumin:3.3 Meds: Methylprednisolone, Miralax, IVF  Recent Labs  Lab 08/09/18 2051 08/10/18 0503  NA 135 139  K 2.8* 3.3*  CL 98 104  CO2 28 29  BUN 16 15  CREATININE 1.10* 0.95  CALCIUM 14.4* 13.4*  MG 2.4  --   GLUCOSE 96 84   NUTRITION - FOCUSED PHYSICAL EXAM: Will re attempt on F/U  Diet Order:   Diet Order            Diet regular Room service appropriate? Yes; Fluid consistency: Thin  Diet effective now             EDUCATION NEEDS:  Education needs have been addressed  Skin:  Skin Assessment: Reviewed RN Assessment  Last BM:  12/31  Height:  Ht Readings from Last 1 Encounters:  08/09/18 '5\' 6"'$  (1.676 m)   Weight:  Wt Readings from Last 1 Encounters:  08/09/18 44.5 kg   Ideal Body Weight:     BMI:  Body mass index is 15.82 kg/m.  Estimated Nutritional Needs:  Kcal:  1800-2000g Pro (40-45 kcal/kg bw) Protein:  >90g Pro (2g/kg bw) Fluid:  1.4 L (30 ml/kg bw)  Burtis Junes RD, LDN, CNSC Clinical Nutrition Available Tues-Sat via Pager: [phone] 08/10/2018 5:27 PM

## 2018-08-10 NOTE — ED Notes (Signed)
Patient given a total of 5 runs of potassium. Final bag complete at 06:40am.

## 2018-08-10 NOTE — Progress Notes (Signed)
Patient is a 63 year old female with past medical history of sarcoidosis who was brought to the emergency department with complaints of altered mental status and fatigue.  She was confused on presentation and was unable to provide any relevant information.  Reported that she was declining with excessive sleepiness/drowsiness and muscle twitching.  Patient found to have calcium of 14.4 on presentation. Hypercalcemia was thought secondary to sarcoidosis.  Patient also was on vitamin D supplementation at home.  Vitamin D level, PTH sent. She has been started on normal saline. We anticipate that her hypercalcemia will gradually improve with hydration.  This morning calcium level was down to 13.4.  Anticipate improvement in the mental status with improvement of hypercalcemia. Patient had recent CT imaging of head/abdomen/pelvis/chest at [facility].  Imaging results were found to be normal as per patient's daughter.  I have requested the nurse to ask the secretary to get those CT imagings report. Also found to be hypokalemic on presentation with potassium of 2.8.  Potassium level improved this morning at 3.3. Chest x-ray done and showed groundglass opacity bilaterally.  Most likely from sarcoidosis.  She follows with pulmonology as an outpatient. Patient seen and examined the bedside.  Currently she is hemodynamically stable. On examination, patient found to be cachectic and malnourished.  She was agitated last night and had to be given Ativan.  Daughter also reported that she has significant weight loss since last few months.  Patient has very poor appetite. We might need to rule out malignancy on this patient.  If we are not able to get the CT imagings report from [facility], we may need to repeat CT imagings for cancer screening. I will request for physical therapy and nutrition consult. Extensive  discussion held with the daughter at the bedside regarding current situation further  management plan. Patient seen by Dr. Sharl Ma this morning.

## 2018-08-11 DIAGNOSIS — E43 Unspecified severe protein-calorie malnutrition: Secondary | ICD-10-CM

## 2018-08-11 DIAGNOSIS — D869 Sarcoidosis, unspecified: Secondary | ICD-10-CM

## 2018-08-11 LAB — BASIC METABOLIC PANEL
Anion gap: 4 — ABNORMAL LOW (ref 5–15)
BUN: 20 mg/dL (ref 8–23)
CO2: 24 mmol/L (ref 22–32)
Calcium: 12.5 mg/dL — ABNORMAL HIGH (ref 8.9–10.3)
Chloride: 109 mmol/L (ref 98–111)
Creatinine, Ser: 0.98 mg/dL (ref 0.44–1.00)
GFR calc Af Amer: >60 mL/min (ref >60)
GFR calc non Af Amer: >60 mL/min (ref >60)
Glucose, Bld: 129 mg/dL — ABNORMAL HIGH (ref 70–99)
Potassium: 3.9 mmol/L (ref 3.5–5.1)
Sodium: 137 mmol/L (ref 135–145)

## 2018-08-11 LAB — HIV ANTIBODY (ROUTINE TESTING W REFLEX): HIV Screen 4th Generation wRfx: NONREACTIVE

## 2018-08-11 LAB — PARATHYROID HORMONE, INTACT (NO CA): PTH: 8 pg/mL — ABNORMAL LOW (ref 15–65)

## 2018-08-11 LAB — CALCITRIOL (1,25 DI-OH VIT D): Vit D, 1,25-Dihydroxy: 109 pg/mL — ABNORMAL HIGH (ref 19.9–79.3)

## 2018-08-11 LAB — MAGNESIUM: MAGNESIUM: 1.7 mg/dL (ref 1.7–2.4)

## 2018-08-11 NOTE — Plan of Care (Signed)

## 2018-08-11 NOTE — Progress Notes (Signed)
At 1800 had bigemeny with pvc's.  Contacted Dr. Stephania FragminAnkit Amin and he ordered labs of mg and bmp

## 2018-08-11 NOTE — Progress Notes (Signed)
Physical Therapy Treatment Patient Details Name: Ebony Bryant Garlick MRN: [id number] DOB: 1956-04-12 Today's Date: 08/11/2018    History of Present Illness Ebony Bryant Finger  is a 63 y.o. female, with history of stage IV sarcoidosis, who was brought to the hospital with complaints of altered mental status and fatigue.  Patient at this time is confused and unable to provide any history.  As per patient's daughter patient has been fatigued and was seen by PCP recently.  She underwent CT imaging of head, chest, abdomen pelvis with no report of acute finding.  The imaging is not available at this time.  [facility]    PT Comments    Patient performance much improved today. Patient able to ambulate 600 feet with IV pole with no losses of balance. Patient stated she needed to go to the bathroom. Patient was able to perform transfers, toileting and hygiene with min guard assistance.  Patient would continue to benefit from skilled physical therapy in current environment and next venue to continue return to prior function and increase strength, endurance, balance, coordination, and functional mobility and gait skills.     Follow Up Recommendations  Home health PT;Supervision - Intermittent;Supervision for mobility/OOB     Equipment Recommendations  Rolling walker with 5" wheels    Recommendations for Other Services       Precautions / Restrictions Precautions Precautions: Fall Restrictions Weight Bearing Restrictions: No    Mobility  Bed Mobility Overal bed mobility: Needs Assistance Bed Mobility: Supine to Sit     Supine to sit: Min guard     General bed mobility comments: slow somewhat lobored movement  Transfers Overall transfer level: Needs assistance Equipment used: Ambulation equipment used Transfers: Sit to/from [employer];Stand Pivot Transfers Sit to Stand: Min guard;Min assist Stand pivot transfers: Min guard;Min assist       General transfer comment:  min assist for line management  Ambulation/Gait Ambulation/Gait assistance: Min guard Gait Distance (Feet): 600 Feet Assistive device: IV Pole Gait Pattern/deviations: Decreased step length - right;Decreased step length - left;Decreased stride length;Narrow base of support Gait velocity: slow   General Gait Details: somwehat slow labored cadence with no buckling of knees or losses of balance   Stairs             Wheelchair Mobility    Modified Rankin (Stroke Patients Only)       Balance Overall balance assessment: Needs assistance Sitting-balance support: Feet supported;No upper extremity supported Sitting balance-Leahy Scale: Good     Standing balance support: During functional activity;Single extremity supported Standing balance-Leahy Scale: Fair Standing balance comment: fair with IV pole                            Cognition Arousal/Alertness: Awake/alert;Lethargic Behavior During Therapy: WFL for tasks assessed/performed Overall Cognitive Status: Within Functional Limits for tasks assessed Area of Impairment: Orientation                 Orientation Level: Person;Time             General Comments: somewhat confused, slightly lethargic      Exercises      General Comments        Pertinent Vitals/Pain Pain Assessment: No/denies pain    Home Living                      Prior Function  PT Goals (current goals can now be found in the care plan section) Acute Rehab PT Goals Patient Stated Goal: return home  PT Goal Formulation: With patient/family Time For Goal Achievement: 08/24/18 Potential to Achieve Goals: Good Progress towards PT goals: Progressing toward goals    Frequency    Min 3X/week      PT Plan Discharge plan needs to be updated    Co-evaluation              AM-PAC PT "6 Clicks" Mobility   Outcome Measure  Help needed turning from your back to your side while in a flat bed  without using bedrails?: A Little Help needed moving from lying on your back to sitting on the side of a flat bed without using bedrails?: A Little Help needed moving to and from a bed to a chair (including a wheelchair)?: A Little Help needed standing up from a chair using your arms (e.g., wheelchair or bedside chair)?: A Little Help needed to walk in hospital room?: A Little Help needed climbing 3-5 steps with a railing? : A Little 6 Click Score: 18    End of Session Equipment Utilized During Treatment: Gait belt Activity Tolerance: Patient tolerated treatment well Patient left: in chair;with call bell/phone within reach;with chair alarm set Nurse Communication: Mobility status PT Visit Diagnosis: Unsteadiness on feet (R26.81);Other abnormalities of gait and mobility (R26.89);Muscle weakness (generalized) (M62.81)     Time: 1140-1210 PT Time Calculation (min) (ACUTE ONLY): 30 min  Charges:  $Gait Training: 8-22 mins $Therapeutic Activity: 8-22 mins                     Katina Dung. Hartnett-Rands, MS, PT [place] [facility] #[id number] 08/11/2018, 12:26 PM

## 2018-08-11 NOTE — Progress Notes (Signed)
PROGRESS NOTE    Ebony Bryant  ZOX:[id number] DOB: Jun 11, 1956 DOA: 08/09/2018 PCP: Aggie Cosier, MD   Brief Narrative:  63 year old female with past medical history of stage IV sarcoidosis, severe protein calorie malnutrition came to the hospital with change in mental status and fatigue.  Apparently patient had been getting more drowsy at home therefore sent to the hospital.  No recent CT of the head, chest ,abdomen, and pelvis was reportedly negative per the daughter.  She sees outpatient pulmonologist.  Upon admission was noted to have calcium of 14.4 and potassium of 2.8..  With IV fluids and Solu-Medrol.   Assessment & Plan:   Active Problems:   Hypercalcemia   Protein-calorie malnutrition, severe  Symptomatic hypercalcemia, improving Stage IV sarcoidosis -Calcium levels are trending down, continue gentle hydration.  Will reduce her fluid rate from 125 cc/h to 75 cc/h - PTH level- low, vitamin D levels pending. - Monitor calcium level with conservative management.  If necessary we will give Lasix. -Follow-up outpatient with pulmonary -Chest x-ray-shows diffuse reticulonodular pattern with fibrosis related to sarcoidosis. -On Solu-Medrol 60 mg every 12 hours.  Should transition this to oral in next 24 hours.  Altered mental status/encephalopathy -Suspect from mild delirium and metabolic derangement.  She is easily directable.  Overall she is alert awake oriented x 3 but off and on talks very unusual.  Hypokalemia -Replete as needed.  Generalized weakness Moderate to severe protein Cal malnutrition - Physical therapy recommends Home PT/OT. Encourage PO diet.    DVT prophylaxis: Lovenox  Code Status: Full Code  Family Communication: None at Bedside   Disposition Plan: TBD  Consultants:   None   Procedures:   None  Antimicrobials:   None   Subjective: Feels weak. Certain parts she is confused about but overall she is AAOx3  Review of Systems Otherwise  negative except as per HPI, including: General: Denies fever, chills, night sweats or unintended weight loss. Resp: Denies cough, wheezing, shortness of breath. Cardiac: Denies chest pain, palpitations, orthopnea, paroxysmal nocturnal dyspnea. GI: Denies abdominal pain, nausea, vomiting, diarrhea or constipation GU: Denies dysuria, frequency, hesitancy or incontinence MS: Denies muscle aches, joint pain or swelling Neuro: Denies headache, neurologic deficits (focal weakness, numbness, tingling), abnormal gait Psych: Denies anxiety, depression, SI/HI/AVH Skin: Denies new rashes or lesions ID: Denies sick contacts, exotic exposures, travel  Objective: Vitals:   08/10/18 1407 08/10/18 1950 08/10/18 2102 08/11/18 0534  BP: 125/79  137/90 (!) 163/100  Pulse: 81  79 74  Resp: 20  20 20   Temp: 98.9 F (37.2 C)  97.8 F (36.6 C) 97.6 F (36.4 C)  TempSrc: Oral  Oral Oral  SpO2: 100% 94% 97% 99%  Weight:      Height:        Intake/Output Summary (Last 24 hours) at 08/11/2018 1258 Last data filed at 08/11/2018 1257 Gross per 24 hour  Intake 2034.28 ml  Output -  Net 2034.28 ml   Filed Weights   08/09/18 1923  Weight: 44.5 kg    Examination:  General exam: Appears calm and comfortable , b/l temporal wasting.  Respiratory system: Clear to auscultation. Respiratory effort normal. Cardiovascular system: S1 & S2 heard, RRR. No JVD, murmurs, rubs, gallops or clicks. No pedal edema. Gastrointestinal system: Abdomen is nondistended, soft and nontender. No organomegaly or masses felt. Normal bowel sounds heard. Central nervous system: Alert and oriented. No focal neurological deficits. Extremities: Symmetric4 x 5 power. Skin: No rashes, lesions or ulcers Psychiatry: Judgement and insight appear  normal. Mood & affect appropriate. Overall somewhat forgetfull    Data Reviewed:   CBC: Recent Labs  Lab 08/09/18 2051 08/10/18 0503 08/10/18 1328  WBC 3.1* 2.1* 2.6*  NEUTROABS  --    --  1.4*  HGB 11.1* 10.6* 9.6*  HCT 35.2* 33.3* 30.5*  MCV 81.1 82.0 83.8  PLT 175 130* 139*   Basic Metabolic Panel: Recent Labs  Lab 08/09/18 2051 08/10/18 0503 08/11/18 0636  NA 135 139 137  K 2.8* 3.3* 3.9  CL 98 104 109  CO2 28 29 24   GLUCOSE 96 84 129*  BUN 16 15 20   CREATININE 1.10* 0.95 0.98  CALCIUM 14.4* 13.4* 12.5*  MG 2.4  --   --    GFR: Estimated Creatinine Clearance: 41.8 mL/min (by C-G formula based on SCr of 0.98 mg/dL). Liver Function Tests: Recent Labs  Lab 08/09/18 2051 08/10/18 0503  AST 22 19  ALT 15 13  ALKPHOS 37* 34*  BILITOT 0.7 0.7  PROT 9.3* 7.8  ALBUMIN 4.0 3.3*   No results for input(s): LIPASE, AMYLASE in the last 168 hours. No results for input(s): AMMONIA in the last 168 hours. Coagulation Profile: No results for input(s): INR, PROTIME in the last 168 hours. Cardiac Enzymes: Recent Labs  Lab 08/09/18 2206  TROPONINI <0.03   BNP (last 3 results) No results for input(s): PROBNP in the last 8760 hours. HbA1C: No results for input(s): HGBA1C in the last 72 hours. CBG: Recent Labs  Lab 08/09/18 2053  GLUCAP 87   Lipid Profile: No results for input(s): CHOL, HDL, LDLCALC, TRIG, CHOLHDL, LDLDIRECT in the last 72 hours. Thyroid Function Tests: Recent Labs    08/09/18 2051  TSH 0.843   Anemia Panel: No results for input(s): VITAMINB12, FOLATE, FERRITIN, TIBC, IRON, RETICCTPCT in the last 72 hours. Sepsis Labs: No results for input(s): PROCALCITON, LATICACIDVEN in the last 168 hours.  Recent Results (from the past 240 hour(s))  Urine culture     Status: Abnormal (Preliminary result)   Collection Time: 08/09/18  9:14 PM  Result Value Ref Range Status   Specimen Description   Final    URINE, CATHETERIZED Performed at [facility], [address] [place], [place] [address]    Special Requests   Final    Normal Performed at [facility], [address] [place], [place] [address]    Culture >=100,000 COLONIES/mL  STAPHYLOCOCCUS LUGDUNENSIS (A)  Final   Report Status PENDING  Incomplete         Radiology Studies: Dg Chest 2 View  Result Date: 08/09/2018 CLINICAL DATA:  Shortness of breath, clinical history of sarcoid. EXAM: CHEST - 2 VIEW COMPARISON:  None. FINDINGS: Hilar retraction bilaterally. Diffuse interstitial reticular opacity suggesting fibrosis. More confluent left greater than right upper lobe opacity. No pleural effusion. Borderline cardiomegaly. No pneumothorax. IMPRESSION: 1. Diffuse reticulonodular pattern with hilar retraction and probable fibrosis in the mid to upper lung zones, likely related to given history of sarcoidosis. 2. There is ground-glass opacity in the left greater than right upper lobes, superimposed infection or acute inflammatory process is difficult to exclude. Electronically Signed   By: Jasmine PangKim  Fujinaga M.D.   On: 08/09/2018 21:40        Scheduled Meds: . enoxaparin (LOVENOX) injection  30 mg Subcutaneous Q24H  . feeding supplement  1 Container Oral TID BM  . methylPREDNISolone (SOLU-MEDROL) injection  60 mg Intravenous Q12H  . polyethylene glycol  17 g Oral Daily  . tuberculin  5 Units  Intradermal Once   Continuous Infusions: . sodium chloride 125 mL/hr at 08/11/18 0630     LOS: 1 day   Time spent= 25 mins    Maedell Hedger Joline Maxcyhirag Marrietta Thunder, MD [facility]  If 7PM-7AM, please contact night-coverage [email or web address] 08/11/2018, 12:58 PM

## 2018-08-11 NOTE — Progress Notes (Signed)
Nutrition Brief Follow Up Note  RD briefly following up to see how pt is tolerating supplements/oral diet.   Pt is much more alert today, though still "hazy". Her daughter reports the patient has been eating nearly 100% of her meals. She has been drinking the [employer]. Pt still does not want to drink the Ensure due to it containing calcium. Per family, MD told them if pts labs continue to improve, pt will likely D/C tomorrow.   RD emphasized to pt the importance of taking care of herself at home, meaning she is eating and drinking regularly and making sure her wt does not further drop  No nutrition interventions warranted at this time. If nutrition issues arise, please consult RD.   Christophe Louis RD, LDN, CNSC Clinical Nutrition Available Tues-Sat via Pager: [phone] 08/11/2018 2:38 PM

## 2018-08-12 DIAGNOSIS — R531 Weakness: Secondary | ICD-10-CM

## 2018-08-12 LAB — URINE CULTURE: Special Requests: NORMAL

## 2018-08-12 LAB — BASIC METABOLIC PANEL
Anion gap: 4 — ABNORMAL LOW (ref 5–15)
BUN: 22 mg/dL (ref 8–23)
CO2: 24 mmol/L (ref 22–32)
Calcium: 10.7 mg/dL — ABNORMAL HIGH (ref 8.9–10.3)
Chloride: 108 mmol/L (ref 98–111)
Creatinine, Ser: 0.75 mg/dL (ref 0.44–1.00)
GFR calc Af Amer: >60 mL/min (ref >60)
GFR calc non Af Amer: >60 mL/min (ref >60)
Glucose, Bld: 101 mg/dL — ABNORMAL HIGH (ref 70–99)
Potassium: 3.2 mmol/L — ABNORMAL LOW (ref 3.5–5.1)
Sodium: 136 mmol/L (ref 135–145)

## 2018-08-12 LAB — COMPREHENSIVE METABOLIC PANEL
ALT: 10 U/L (ref 0–44)
AST: 15 U/L (ref 15–41)
Albumin: 2.7 g/dL — ABNORMAL LOW (ref 3.5–5.0)
Alkaline Phosphatase: 30 U/L — ABNORMAL LOW (ref 38–126)
Anion gap: 6 (ref 5–15)
BUN: 23 mg/dL (ref 8–23)
CO2: 22 mmol/L (ref 22–32)
Calcium: 10.8 mg/dL — ABNORMAL HIGH (ref 8.9–10.3)
Chloride: 108 mmol/L (ref 98–111)
Creatinine, Ser: 0.82 mg/dL (ref 0.44–1.00)
GFR calc non Af Amer: >60 mL/min (ref >60)
Glucose, Bld: 121 mg/dL — ABNORMAL HIGH (ref 70–99)
Potassium: 3.4 mmol/L — ABNORMAL LOW (ref 3.5–5.1)
Sodium: 136 mmol/L (ref 135–145)
Total Bilirubin: 0.4 mg/dL (ref 0.3–1.2)
Total Protein: 6.7 g/dL (ref 6.5–8.1)

## 2018-08-12 MED ORDER — MAGNESIUM OXIDE 400 (241.3 MG) MG PO TABS
800.0000 mg | ORAL_TABLET | Freq: Once | ORAL | Status: AC
  Administered 2018-08-12: 800 mg via ORAL
  Filled 2018-08-12: qty 2

## 2018-08-12 MED ORDER — POTASSIUM CHLORIDE CRYS ER 20 MEQ PO TBCR
20.0000 meq | EXTENDED_RELEASE_TABLET | Freq: Once | ORAL | Status: AC
  Administered 2018-08-12: 20 meq via ORAL
  Filled 2018-08-12: qty 1

## 2018-08-12 MED ORDER — PREDNISONE 10 MG PO TABS: ORAL_TABLET | ORAL | 0 refills | Status: AC

## 2018-08-12 MED ORDER — POTASSIUM CHLORIDE CRYS ER 20 MEQ PO TBCR
40.0000 meq | EXTENDED_RELEASE_TABLET | Freq: Once | ORAL | Status: AC
  Administered 2018-08-12: 40 meq via ORAL

## 2018-08-12 NOTE — Clinical Social Work Note (Addendum)
CSW is aware of imminent discharge and will send information for discharge packet once the following occur:  1. TB test is read. 2. DC Summary is available 3. FL 2 is completed.  The CSW has been in contact with the attending MD who is aware of these needs. The CSW will contact the attending RN once the CSW has prepared the documentation.  UPDATE: FL 2 and discharge summary are complete. The CSW is waiting for TB test read and has printed complete documentation to the department printer. The CSW has spoken with the attending RN and advised her of the transport by family. The RN is aware that the patient cannot discharge until the TB test has been read and updated in the chart. CSW is signing off. Please consult should needs arise. Argentina Ponder, MSW, Theresia Majors [phone]

## 2018-08-12 NOTE — [place] FL2 (Signed)
[place] MEDICAID FL2 LEVEL OF CARE SCREENING TOOL     IDENTIFICATION  Patient Name: Ebony Bryant Birthdate: 09/16/1955 Sex: female Admission Date (Current Location): 08/09/2018  [facility] and [place] Number:  [employer] and Address:  [facility],  [address], [place] [address]      Provider Number: [id number]  Attending Physician Name and Address:  Dimple Nanas, MD  Relative Name and Phone Number:       Current Level of Care: Hospital Recommended Level of Care: [facility] Prior Approval Number:    Date Approved/Denied:   PASRR Number:    Discharge Plan: Domiciliary (Rest home)([facility])    Current Diagnoses: Patient Active Problem List   Diagnosis Date Noted  . Protein-calorie malnutrition, severe 08/11/2018  . Hypercalcemia 08/10/2018    Orientation RESPIRATION BLADDER Height & Weight     Self, Place, Situation  Normal Continent Weight: 98 lb (44.5 kg) Height:  5\' 6"  (167.6 cm)  BEHAVIORAL SYMPTOMS/MOOD NEUROLOGICAL BOWEL NUTRITION STATUS      Continent Diet(regular )  AMBULATORY STATUS COMMUNICATION OF NEEDS Skin   Limited Assist Verbally Normal                       Personal Care Assistance Level of Assistance  Bathing, Feeding, Dressing Bathing Assistance: Limited assistance Feeding assistance: Independent Dressing Assistance: Limited assistance     Functional Limitations Info  Sight, Hearing, Speech Sight Info: Adequate Hearing Info: Adequate Speech Info: Adequate    SPECIAL CARE FACTORS FREQUENCY  PT (By licensed PT)     PT Frequency: 3x/week              Contractures Contractures Info: Not present    Additional Factors Info  Code Status, Allergies Code Status Info: Full Code Allergies Info: NKA           Current Medications (08/12/2018):  This is the current hospital active medication list Current Facility-Administered Medications  Medication Dose Route Frequency Provider Last Rate  Last Dose  . 0.9 %  sodium chloride infusion   Intravenous Continuous Dimple Nanas, MD 75 mL/hr at 08/12/18 0548    . enoxaparin (LOVENOX) injection 30 mg  30 mg Subcutaneous Q24H Adhikari, Amrit, MD   30 mg at 08/12/18 0900  . feeding supplement (BOOST / RESOURCE BREEZE) liquid 1 Container  1 Container Oral TID BM Dimple Nanas, MD   1 Container at 08/11/18 2054  . LORazepam (ATIVAN) injection 1 mg  1 mg Intravenous Q4H PRN Meredeth Ide, MD      . methylPREDNISolone sodium succinate (SOLU-MEDROL) 125 mg/2 mL injection 60 mg  60 mg Intravenous Q12H Meredeth Ide, MD   60 mg at 08/12/18 0005  . ondansetron (ZOFRAN) tablet 4 mg  4 mg Oral Q6H PRN Meredeth Ide, MD       Or  . ondansetron (ZOFRAN) injection 4 mg  4 mg Intravenous Q6H PRN Sharl Ma, Sarina Ill, MD      . polyethylene glycol (MIRALAX / GLYCOLAX) packet 17 g  17 g Oral Daily Burnadette Pop, MD   17 g at 08/12/18 0900  . tuberculin injection 5 Units  5 Units Intradermal Once Burnadette Pop, MD   5 Units at 08/10/18 1619     Discharge Medications: Medication List    TAKE these medications   HYZAAR PO Take 1 tablet by mouth daily.   multivitamin with minerals Tabs tablet Take 1 tablet by mouth  daily.   predniSONE 10 MG tablet Commonly known as:  DELTASONE Take 4 tablets (40 mg total) by mouth daily for 2 days, THEN 3 tablets (30 mg total) daily for 2 days, THEN 2 tablets (20 mg total) daily for 2 days, THEN 1 tablet (10 mg total) daily for 2 days. Start taking on:  August 12, 2018   VITAMIN D PO Take 1 tablet by mouth daily.      Relevant Imaging Results:  Relevant Lab Results:   Additional Information SSN [id number] [place], [place]

## 2018-08-12 NOTE — Discharge Summary (Signed)
Physician Discharge Summary  Ebony Bryant YNW:[id number] DOB: 03-Feb-1956 DOA: 08/09/2018  PCP: Aggie Cosier, MD  Admit date: 08/09/2018 Discharge date: 08/12/2018  Admitted From: Home Disposition: Long-term care  Recommendations for Outpatient Follow-up:  1. Follow up with PCP in 1-2 weeks 2. Please obtain BMP/CBC in 3-4 days your next doctors visit.  3. Take oral prednisone as prescribed 4. Follow-up outpatient with pulmonary  Home Health: PT Equipment/Devices: None Discharge Condition: Stable CODE STATUS: Full code Diet recommendation: Regular  Brief/Interim Summary: 63 year old female with past medical history of stage IV sarcoidosis, severe protein calorie malnutrition came to the hospital with change in mental status and fatigue.  Apparently patient had been getting more drowsy at home therefore sent to the hospital.  No recent CT of the head, chest ,abdomen, and pelvis was reportedly negative per the daughter.  She sees outpatient pulmonologist.  Upon admission was noted to have calcium of 14.4 and potassium of 2.8..  With IV fluids and Solu-Medrol.  Over the course of couple of days patient's calcium trended down to 10.8 and was aggressively repleted for hypokalemia.  She was tolerating oral diet without any issues.  Due to weakness she was eval by physical therapy who recommended home PT.  Arrangements were made. Today patient has reached max benefit from hospital stay and stable for discharge with outpatient follow-up recommendations as stated above.   Discharge Diagnoses:  Active Problems:   Hypercalcemia   Protein-calorie malnutrition, severe  Symptomatic hypercalcemia, improving Stage IV sarcoidosis - This is improved.  Calcium levels are trended down to 10.8.  Calcitriol levels elevated, advised to hold off on vitamin D supplements until seen by outpatient primary care provider for now.  Needs to follow-up outpatient with pulmonary. -Chest x-ray-shows diffuse  reticulonodular pattern with fibrosis related to sarcoidosis. -We will transition Solu-Medrol to oral prednisone, taper over next 8 days.  Altered mental status/encephalopathy -This is resolved.  Mentation is back to baseline.  Hypokalemia -Repleted  Generalized weakness Moderate to severe protein Cal malnutrition - PT recommended home health   Patient on Lovenox for DVT prophylaxis She was full code Spoke with the patient's daughter on the day of discharge Discharge today in stable condition with recommendations as stated above.  Discharge Instructions  Discharge Instructions    Discharge patient   Complete by:  As directed    Once home health is set up   Discharge disposition:  01-Home or Self Care   Discharge patient date:  08/12/2018     Allergies as of 08/12/2018   No Known Allergies     Medication List    TAKE these medications   HYZAAR PO Take 1 tablet by mouth daily.   multivitamin with minerals Tabs tablet Take 1 tablet by mouth daily.   predniSONE 10 MG tablet Commonly known as:  DELTASONE Take 4 tablets (40 mg total) by mouth daily for 2 days, THEN 3 tablets (30 mg total) daily for 2 days, THEN 2 tablets (20 mg total) daily for 2 days, THEN 1 tablet (10 mg total) daily for 2 days. Start taking on:  August 12, 2018   VITAMIN D PO Take 1 tablet by mouth daily.      Contact information for after-discharge care    Destination    [facility] .   Service:  Group Home Contact information: [address] [place] [address] [phone]             No Known  Allergies  You were cared for by a hospitalist during your hospital stay. If you have any questions about your discharge medications or the care you received while you were in the hospital after you are discharged, you can call the unit and asked to speak with the hospitalist on call if the hospitalist that took care of you is not available. Once you are  discharged, your primary care physician will handle any further medical issues. Please note that no refills for any discharge medications will be authorized once you are discharged, as it is imperative that you return to your primary care physician (or establish a relationship with a primary care physician if you do not have one) for your aftercare needs so that they can reassess your need for medications and monitor your lab values.  Consultations:  None   Procedures/Studies: Dg Chest 2 View  Result Date: 08/09/2018 CLINICAL DATA:  Shortness of breath, clinical history of sarcoid. EXAM: CHEST - 2 VIEW COMPARISON:  None. FINDINGS: Hilar retraction bilaterally. Diffuse interstitial reticular opacity suggesting fibrosis. More confluent left greater than right upper lobe opacity. No pleural effusion. Borderline cardiomegaly. No pneumothorax. IMPRESSION: 1. Diffuse reticulonodular pattern with hilar retraction and probable fibrosis in the mid to upper lung zones, likely related to given history of sarcoidosis. 2. There is ground-glass opacity in the left greater than right upper lobes, superimposed infection or acute inflammatory process is difficult to exclude. Electronically Signed   By: Jasmine PangKim  Fujinaga M.D.   On: 08/09/2018 21:40      Subjective: Feels better, no acute events overnight.   Discharge Exam: Vitals:   08/11/18 2137 08/12/18 0543  BP: (!) 138/97 (!) 151/98  Pulse: 68 70  Resp: 16 18  Temp: 98.1 F (36.7 C) 98.1 F (36.7 C)  SpO2: 98% 100%   Vitals:   08/11/18 1440 08/11/18 2021 08/11/18 2137 08/12/18 0543  BP: (!) 151/92  (!) 138/97 (!) 151/98  Pulse: 75  68 70  Resp: 20  16 18   Temp:   98.1 F (36.7 C) 98.1 F (36.7 C)  TempSrc:   Oral   SpO2: 98% 96% 98% 100%  Weight:      Height:        General: Pt is alert, awake, not in acute distress Cardiovascular: RRR, S1/S2 +, no rubs, no gallops Respiratory: CTA bilaterally, no wheezing, no rhonchi Abdominal: Soft, NT,  ND, bowel sounds + Extremities: no edema, no cyanosis    The results of significant diagnostics from this hospitalization (including imaging, microbiology, ancillary and laboratory) are listed below for reference.     Microbiology: Recent Results (from the past 240 hour(s))  Urine culture     Status: Abnormal   Collection Time: 08/09/18  9:14 PM  Result Value Ref Range Status   Specimen Description   Final    URINE, CATHETERIZED Performed at [facility], [address] [place], [place] [address]    Special Requests   Final    Normal Performed at [facility], [address] [place], [place] [address]    Culture >=100,000 COLONIES/mL STAPHYLOCOCCUS LUGDUNENSIS (A)  Final   Report Status 08/12/2018 FINAL  Final   Organism ID, Bacteria STAPHYLOCOCCUS LUGDUNENSIS (A)  Final      Susceptibility   Staphylococcus lugdunensis - MIC*    CIPROFLOXACIN <=0.5 SENSITIVE Sensitive     GENTAMICIN <=0.5 SENSITIVE Sensitive     NITROFURANTOIN <=16 SENSITIVE Sensitive     OXACILLIN 0.5 SENSITIVE Sensitive     TETRACYCLINE <=  1 SENSITIVE Sensitive     VANCOMYCIN <=0.5 SENSITIVE Sensitive     TRIMETH/SULFA <=10 SENSITIVE Sensitive     CLINDAMYCIN <=0.25 SENSITIVE Sensitive     RIFAMPIN <=0.5 SENSITIVE Sensitive     Inducible Clindamycin NEGATIVE Sensitive     * >=100,000 COLONIES/mL STAPHYLOCOCCUS LUGDUNENSIS     Labs: BNP (last 3 results) No results for input(s): BNP in the last 8760 hours. Basic Metabolic Panel: Recent Labs  Lab 08/09/18 2051 08/10/18 0503 08/11/18 0636 08/11/18 1940 08/12/18 0648  NA 135 139 137  --  136  K 2.8* 3.3* 3.9  --  3.4*  CL 98 104 109  --  108  CO2 28 29 24   --  22  GLUCOSE 96 84 129*  --  121*  BUN 16 15 20   --  23  CREATININE 1.10* 0.95 0.98  --  0.82  CALCIUM 14.4* 13.4* 12.5*  --  10.8*  MG 2.4  --   --  1.7  --    Liver Function Tests: Recent Labs  Lab 08/09/18 2051 08/10/18 0503 08/12/18 0648  AST 22 19 15   ALT 15 13 10   ALKPHOS 37*  34* 30*  BILITOT 0.7 0.7 0.4  PROT 9.3* 7.8 6.7  ALBUMIN 4.0 3.3* 2.7*   No results for input(s): LIPASE, AMYLASE in the last 168 hours. No results for input(s): AMMONIA in the last 168 hours. CBC: Recent Labs  Lab 08/09/18 2051 08/10/18 0503 08/10/18 1328  WBC 3.1* 2.1* 2.6*  NEUTROABS  --   --  1.4*  HGB 11.1* 10.6* 9.6*  HCT 35.2* 33.3* 30.5*  MCV 81.1 82.0 83.8  PLT 175 130* 139*   Cardiac Enzymes: Recent Labs  Lab 08/09/18 2206  TROPONINI <0.03   BNP: Invalid input(s): POCBNP CBG: Recent Labs  Lab 08/09/18 2053  GLUCAP 87   D-Dimer No results for input(s): DDIMER in the last 72 hours. Hgb A1c No results for input(s): HGBA1C in the last 72 hours. Lipid Profile No results for input(s): CHOL, HDL, LDLCALC, TRIG, CHOLHDL, LDLDIRECT in the last 72 hours. Thyroid function studies Recent Labs    08/09/18 2051  TSH 0.843   Anemia work up No results for input(s): VITAMINB12, FOLATE, FERRITIN, TIBC, IRON, RETICCTPCT in the last 72 hours. Urinalysis    Component Value Date/Time   COLORURINE YELLOW 08/09/2018 2114   APPEARANCEUR HAZY (A) 08/09/2018 2114   LABSPEC 1.008 08/09/2018 2114   PHURINE 7.0 08/09/2018 2114   GLUCOSEU NEGATIVE 08/09/2018 2114   HGBUR NEGATIVE 08/09/2018 2114   BILIRUBINUR NEGATIVE 08/09/2018 2114   KETONESUR NEGATIVE 08/09/2018 2114   PROTEINUR NEGATIVE 08/09/2018 2114   NITRITE NEGATIVE 08/09/2018 2114   LEUKOCYTESUR SMALL (A) 08/09/2018 2114   Sepsis Labs Invalid input(s): PROCALCITONIN,  WBC,  LACTICIDVEN Microbiology Recent Results (from the past 240 hour(s))  Urine culture     Status: Abnormal   Collection Time: 08/09/18  9:14 PM  Result Value Ref Range Status   Specimen Description   Final    URINE, CATHETERIZED Performed at [facility], [address] [place], [place] [address]    Special Requests   Final    Normal Performed at [facility], [address] [place], [place] [address]    Culture >=100,000 COLONIES/mL  STAPHYLOCOCCUS LUGDUNENSIS (A)  Final   Report Status 08/12/2018 FINAL  Final   Organism ID, Bacteria STAPHYLOCOCCUS LUGDUNENSIS (A)  Final      Susceptibility   Staphylococcus lugdunensis - MIC*    CIPROFLOXACIN <=  0.5 SENSITIVE Sensitive     GENTAMICIN <=0.5 SENSITIVE Sensitive     NITROFURANTOIN <=16 SENSITIVE Sensitive     OXACILLIN 0.5 SENSITIVE Sensitive     TETRACYCLINE <=1 SENSITIVE Sensitive     VANCOMYCIN <=0.5 SENSITIVE Sensitive     TRIMETH/SULFA <=10 SENSITIVE Sensitive     CLINDAMYCIN <=0.25 SENSITIVE Sensitive     RIFAMPIN <=0.5 SENSITIVE Sensitive     Inducible Clindamycin NEGATIVE Sensitive     * >=100,000 COLONIES/mL STAPHYLOCOCCUS LUGDUNENSIS     Time coordinating discharge:  I have spent 35 minutes face to face with the patient and on the ward discussing the patients care, assessment, plan and disposition with other care givers. >50% of the time was devoted counseling the patient about the risks and benefits of treatment/Discharge disposition and coordinating care.   SIGNED:   Dimple Nanas, MD  [facility] 08/12/2018, 10:48 AM Pager   If 7PM-7AM, please contact night-coverage [email or web address] Password [email or web address]

## 2018-08-12 NOTE — Progress Notes (Signed)
Pt discharged to home in stable condition, all discharge instructions and rx's x 1 reviewed with and given to pt's daughter.  Pt transported off unit via wheelchair with staff transport x 1 at chairside. [email or web address]

## 2019-08-07 IMAGING — DX DG CHEST 2V
2 series · 2 of 2 positions shown · non-contrast
Comparison: None.

CLINICAL DATA: Shortness of breath, clinical history of sarcoid.

EXAM:
CHEST - 2 VIEW

[chest lat]
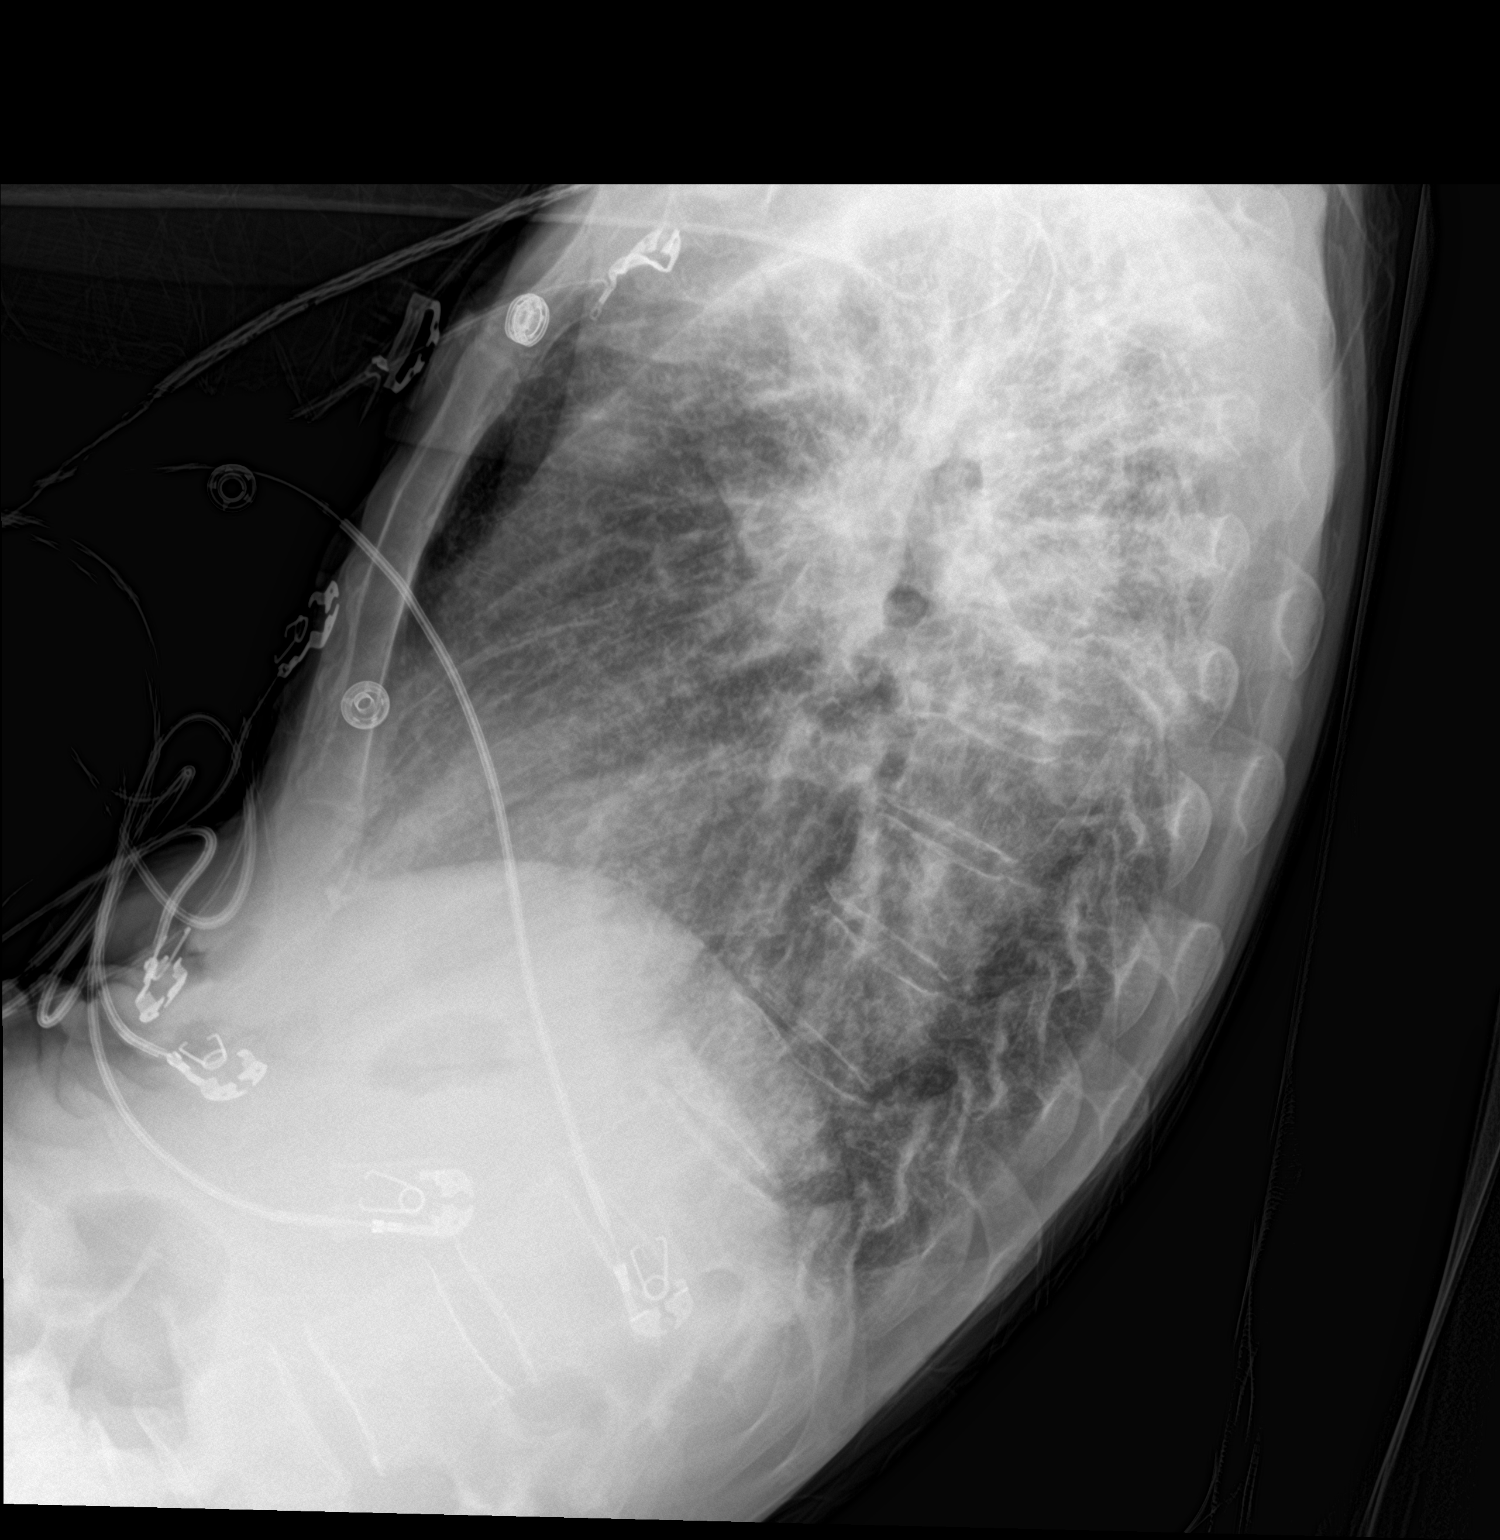

[chest ap]
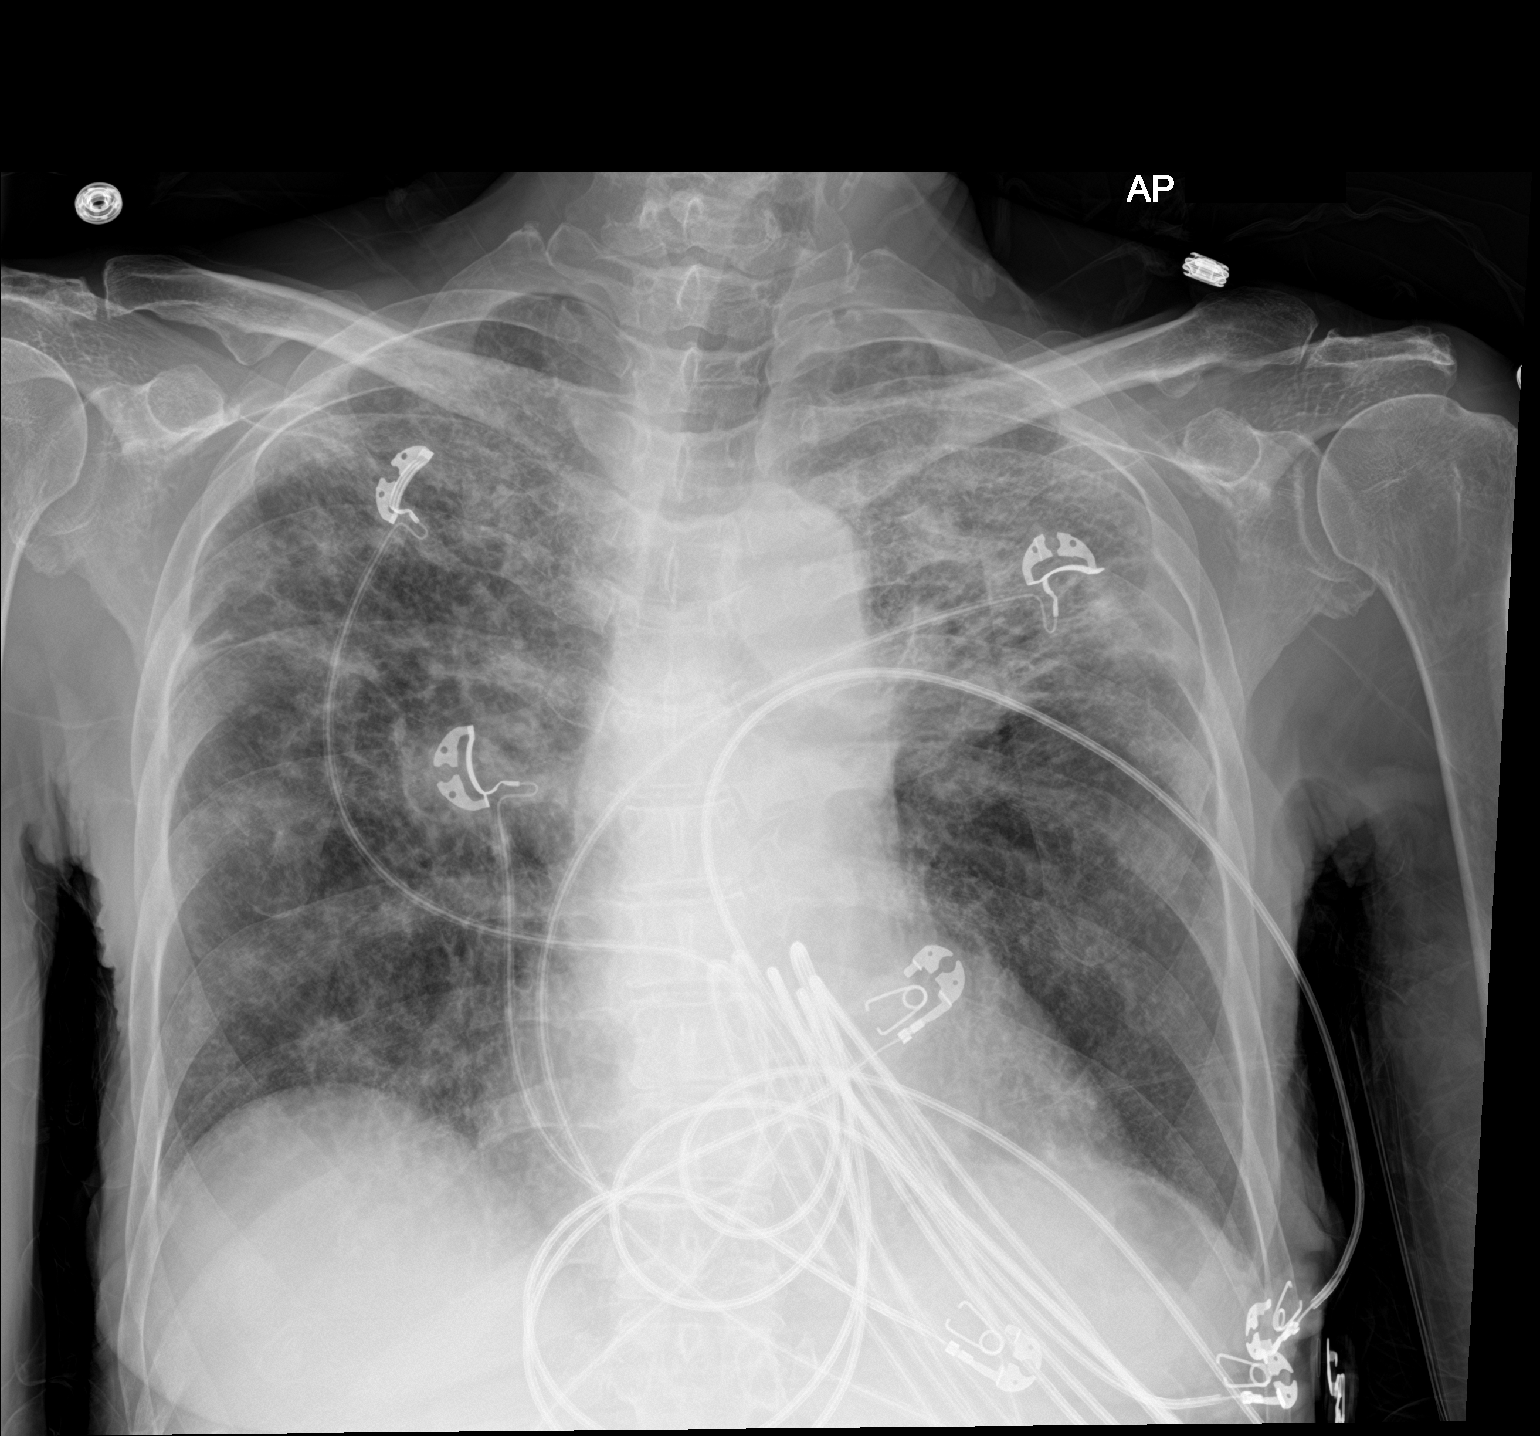

[2 of 2 positions shown; findings below may reference images not displayed]

FINDINGS: Hilar retraction bilaterally. Diffuse interstitial reticular opacity
suggesting fibrosis. More confluent left greater than right upper
lobe opacity. No pleural effusion. Borderline cardiomegaly. No
pneumothorax.
IMPRESSION: 1. Diffuse reticulonodular pattern with hilar retraction and
probable fibrosis in the mid to upper lung zones, likely related to
given history of sarcoidosis.
2. There is ground-glass opacity in the left greater than right
upper lobes, superimposed infection or acute inflammatory process is
difficult to exclude.
